# Patient Record
Sex: Female | Born: 1985 | Race: White | Hispanic: No | Marital: Single | Smoking: Never smoker
Health system: Southern US, Community
[De-identification: ages and names within clinical notes are randomized; demographics above are authoritative.]

## PROBLEM LIST (undated history)

## (undated) HISTORY — PX: WISDOM TOOTH EXTRACTION: SHX21

---
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
## 2009-02-02 ENCOUNTER — Emergency Department (HOSPITAL_COMMUNITY): Admission: EM | Admit: 2009-02-02 | Discharge: 2009-02-02 | Payer: Self-pay | Admitting: Family Medicine

## 2011-01-12 ENCOUNTER — Inpatient Hospital Stay (INDEPENDENT_AMBULATORY_CARE_PROVIDER_SITE_OTHER)
Admission: RE | Admit: 2011-01-12 | Discharge: 2011-01-12 | Disposition: A | Discharge status: Home / Self Care | Payer: BC Managed Care – PPO | Source: Ambulatory Visit | Attending: Emergency Medicine | Admitting: Emergency Medicine

## 2011-01-12 DIAGNOSIS — L259 Unspecified contact dermatitis, unspecified cause: Secondary | ICD-10-CM

## 2011-01-12 DIAGNOSIS — T7840XA Allergy, unspecified, initial encounter: Secondary | ICD-10-CM

## 2011-04-07 ENCOUNTER — Inpatient Hospital Stay (INDEPENDENT_AMBULATORY_CARE_PROVIDER_SITE_OTHER)
Admission: RE | Admit: 2011-04-07 | Discharge: 2011-04-07 | Disposition: A | Discharge status: Home / Self Care | Payer: BC Managed Care – PPO | Source: Ambulatory Visit | Attending: Emergency Medicine | Admitting: Emergency Medicine

## 2011-04-07 DIAGNOSIS — M79609 Pain in unspecified limb: Secondary | ICD-10-CM

## 2011-04-07 LAB — D-DIMER, QUANTITATIVE: D-Dimer, Quant: 0.96 ug/mL-FEU — ABNORMAL HIGH (ref 0.00–0.48)

## 2011-04-08 ENCOUNTER — Ambulatory Visit (HOSPITAL_COMMUNITY)
Admission: AD | Admit: 2011-04-08 | Discharge: 2011-04-08 | Disposition: A | Discharge status: Home / Self Care | Payer: BC Managed Care – PPO | Source: Ambulatory Visit | Attending: Emergency Medicine | Admitting: Emergency Medicine

## 2011-04-08 DIAGNOSIS — M79609 Pain in unspecified limb: Secondary | ICD-10-CM | POA: Insufficient documentation

## 2011-04-20 ENCOUNTER — Ambulatory Visit (INDEPENDENT_AMBULATORY_CARE_PROVIDER_SITE_OTHER): Payer: BC Managed Care – PPO | Admitting: Family Medicine

## 2011-04-20 VITALS — BP 133/85 | Ht 63.0 in | Wt 160.0 lb

## 2011-04-20 DIAGNOSIS — S86819A Strain of other muscle(s) and tendon(s) at lower leg level, unspecified leg, initial encounter: Secondary | ICD-10-CM

## 2011-04-20 DIAGNOSIS — S86119A Strain of other muscle(s) and tendon(s) of posterior muscle group at lower leg level, unspecified leg, initial encounter: Secondary | ICD-10-CM

## 2011-04-20 MED ORDER — MELOXICAM 15 MG PO TABS: 15.0000 mg | ORAL_TABLET | Freq: Every day | ORAL | Status: DC

## 2011-04-20 NOTE — Progress Notes (Signed)
  Subjective:    Patient ID: Madeline Cobb, female    DOB: 12/24/1985, 25 y.o.   MRN: [id number]  HPI  Yamila is a plesant 25 yo who has been  training for a half marathon. On 04/07/11 she was running and she felt tightness in her right calf and she keep running shortly after excruciating pain on her right calf and she could not continue running. She was sen in the urgent care and diagnosed with a calf strain . She went the next day to have a MSK U/S which diagnosed a gastroc. strain. She was recommended to use a calf sleeve, iced, rest and voltaren. She was doing well until 04/18/11 when she tried to run again and she developed severe pain on her right calf. She denies any numbness or tingling she is walking w/o a limp.  No past medical history on file. No current outpatient prescriptions on file prior to visit.   Allergies no known allergies    Review of Systems  Constitutional: Negative for fever, chills and fatigue.  Musculoskeletal: Negative for back pain, joint swelling and gait problem.       R calf pain with HPI  Neurological: Negative for weakness and numbness.       Objective:   Physical Exam  Constitutional:       BP 133/85  Ht 5\' 3"  (1.6 m)  Wt 160 lb (72.576 kg)  BMI 28.34 kg/m2   Pulmonary/Chest: Effort normal.  Musculoskeletal:       Posterior right calf/ intact skin, mild swelling no hematomas. There is TTP in the lateral head of the gastrocnemius muscle in the mid third of her calf. No defect. Achilles tendon intact. Thompson test negative. She is able to stand on plantar flexion. There is pain with plantar flexion against resistance. Gait independent without a limp Sensation intact distally. Feet with mild arch.    Neurological: She is alert.  Skin: Skin is warm. No rash noted. No erythema. No pallor.  Psychiatric: She has a normal mood and affect. Judgment normal.   MSK U/S : No hematomas or tear visualized.     Assessment & Plan:   1.  Gastrocnemius muscle strain  meloxicam (MOBIC) 15 MG tablet  Calf sleeve. Cryotherapy. Cross training. MOBIC. GASTROC STRETCHES EXERCISES EXPLAINED AND HANDOUT GIVEN. F/u IN 4 WEEKS.

## 2011-07-03 ENCOUNTER — Ambulatory Visit (INDEPENDENT_AMBULATORY_CARE_PROVIDER_SITE_OTHER): Payer: BC Managed Care – PPO | Admitting: Family Medicine

## 2011-07-03 VITALS — BP 131/88

## 2011-07-03 DIAGNOSIS — S86819A Strain of other muscle(s) and tendon(s) at lower leg level, unspecified leg, initial encounter: Secondary | ICD-10-CM

## 2011-07-03 DIAGNOSIS — S86119A Strain of other muscle(s) and tendon(s) of posterior muscle group at lower leg level, unspecified leg, initial encounter: Secondary | ICD-10-CM

## 2011-07-06 DIAGNOSIS — S86119A Strain of other muscle(s) and tendon(s) of posterior muscle group at lower leg level, unspecified leg, initial encounter: Secondary | ICD-10-CM | POA: Insufficient documentation

## 2011-07-06 NOTE — Progress Notes (Signed)
  Subjective:    Patient ID: Madeline Cobb, female    DOB: 03/08/1986, 25 y.o.   MRN: [id number]  HPI  Acute onset right calf pain as she was runnig up a hill. Hairfield and localized. Unable to finish the run. Has been using crutches sinnce this occurred (48 hr ago) and has pain with weight bearing still. Similar injury in August. Longest run is usually 5-10 k. Trains qod, varied surfaces.   No foot numbness, no knee pain. No bruising.  Review of Systems ,nfnw     Objective:   Physical Exam  Vital signs reviewed. GENERAL: Well developed, well nourished, overweight, no acute distress. Walking with crutches. Right calf ttp mid calf, no defect. Achiiles is intact, normal thompson test. Calf is without bruising, no erythmea or warmth. ULTRASOUND:  Achiiles intact and normall in appearance. Medial and lateral gastroc and soleus reveal no defect, no increased areas of vascularity using doppler.        Assessment & Plan:  Calf strain--moderate. Copnservative care including icing, WBAT, gradual advancement to walking. RTC 2-3 weeks--will start eccentric rehab exercises then and if possible will do gait analysis.

## 2011-07-24 ENCOUNTER — Ambulatory Visit (INDEPENDENT_AMBULATORY_CARE_PROVIDER_SITE_OTHER): Payer: BC Managed Care – PPO | Admitting: Sports Medicine

## 2011-07-24 VITALS — BP 126/82

## 2011-07-24 DIAGNOSIS — S838X9A Sprain of other specified parts of unspecified knee, initial encounter: Secondary | ICD-10-CM

## 2011-07-24 DIAGNOSIS — S86119A Strain of other muscle(s) and tendon(s) of posterior muscle group at lower leg level, unspecified leg, initial encounter: Secondary | ICD-10-CM

## 2011-07-24 NOTE — Patient Instructions (Signed)
Use green sports insoles with heel lifts in running shoes  Use calf sleeve when you are exercising  Restart calf exercises for 1st week do them on both legs- 3 sets of 15 reps, the next week try doing the exercises on 1 leg at a time building up to 3 sets of 15 reps- try to do these with knees straight and knees bent at 20 degree angle   Ok to start back running- with intervals of running 1 minute then walking 1 minute for a total of 20 minutes for 1 week, the next week increase to total of 25 minutes, and the 3rd week increase to total of 30 minute total, on the 4th week increase to intervals of 10 minutes of running and 2 minutes walking for a total of 40 minutes, 5th week increase to total of 50 minutes, and 6th week increase to total of 60 minutes  Please follow up with [place] in 6 weeks.  Thank you for seeing [place] today!

## 2011-07-24 NOTE — Progress Notes (Signed)
  Subjective:    Patient ID: Madeline Cobb, female    DOB: May 03, 1986, 25 y.o.   MRN: [id number]  HPI  Pt here for f/u of rt calf pain which she states about 60% improved.   No pain with walking. Has not been doing any running or calf exercises since last visit, per Dr. Donnetta Hail instruction.   She had this injury one time before last April Normally she runs about 4 days a week and up to about one hour for her runs  Review of Systems     Objective:   Physical Exam  Slight tenderness at 7 inches above calcaneus at juncture of the head of medial and lateral gastrocs on right calf Left calf nontender Moderately high arch bilaterally No significant foot breakdown Hyperextension of both knees there is mild Strong hip abduction Walking gait is neutral       Assessment & Plan:

## 2011-07-24 NOTE — Assessment & Plan Note (Signed)
We gave her a new compression sleeve  Begin a gradual walk run program  Begin a series of calf exercises  Sports and soles with a heel lift  Recheck in about 6 weeks  If she does reinjure her calf we may want to move from sports and soles custom orthotics

## 2011-08-30 ENCOUNTER — Ambulatory Visit: Payer: BC Managed Care – PPO | Admitting: Sports Medicine

## 2011-09-13 ENCOUNTER — Ambulatory Visit (INDEPENDENT_AMBULATORY_CARE_PROVIDER_SITE_OTHER): Payer: BC Managed Care – PPO | Admitting: Sports Medicine

## 2011-09-13 VITALS — BP 128/80

## 2011-09-13 DIAGNOSIS — S838X9A Sprain of other specified parts of unspecified knee, initial encounter: Secondary | ICD-10-CM

## 2011-09-13 DIAGNOSIS — S86119A Strain of other muscle(s) and tendon(s) of posterior muscle group at lower leg level, unspecified leg, initial encounter: Secondary | ICD-10-CM

## 2011-09-13 NOTE — Progress Notes (Signed)
  Subjective:    Patient ID: Madeline Cobb, female    DOB: 1986/05/31, 26 y.o.   MRN: [id number]  HPI Patient comes in for followup of a right calf strain. She has been running and training for a half marathon coming up in April of this year. She is actually doing very well, and is approximately 90% better. She has been using a calf sleeve, heel lifts. She did remove the sports insoles as these were causing her pain, she feels much better and her current insoles that came with the shoes. She continues to do the rehabilitation exercises.   Review of Systems    No fevers, chills, night sweats, weight loss, chest pain, or shortness of breath.  Objective:   Physical Exam General:  Well developed, well nourished, and in no acute distress. Neuro:  Alert and oriented x3, extra-ocular muscles intact. Skin: Warm and dry, no rashes noted.  Musculoskeletal: Right calf is unremarkable to inspection. There is no tenderness to palpation, and no gaps in the muscle itself. Full strength to inversion, eversion, dorsiflexion, plantar flexion.  Running gait is very neutral, the only abnormality is that she turns in her right foot slightly.    Assessment & Plan:   Calf strain: I think this is essentially healed. She should continue to do the rehabilitation exercises, I provided her with another set of heel lifts to place in her other shoes. She may continue to wear a calf sleeve. She may continue to use anti-inflammatories as needed. Once she really start training for a half marathon, she may come back to see [place] if pain recurs, and we can build her custom orthotics at that point, with a built in heel lift. We will see her back on an as-needed basis.

## 2011-11-07 LAB — HM PAP SMEAR: HM Pap smear: NEGATIVE

## 2012-01-16 ENCOUNTER — Encounter: Payer: Self-pay | Admitting: Sports Medicine

## 2012-01-16 ENCOUNTER — Ambulatory Visit (INDEPENDENT_AMBULATORY_CARE_PROVIDER_SITE_OTHER): Payer: BC Managed Care – PPO | Admitting: Sports Medicine

## 2012-01-16 VITALS — BP 110/74

## 2012-01-16 DIAGNOSIS — M25569 Pain in unspecified knee: Secondary | ICD-10-CM

## 2012-01-16 DIAGNOSIS — M25561 Pain in right knee: Secondary | ICD-10-CM

## 2012-01-16 NOTE — Assessment & Plan Note (Addendum)
New problem. Plan have patient use knee compression sleeve  gave iliotibial band and hip stretches.  Patient to return in 6 weeks for followup  Scaphoid pad added to her right insoles which also had heel lifts

## 2012-01-16 NOTE — Progress Notes (Signed)
  Subjective:    Patient ID: Madeline Cobb, female    DOB: 09/13/1985, 26 y.o.   MRN: [id number]  HPI Patient presents today with right lateral knee pain after running a half marathon 3 weeks ago. She says that she ran a race without any knee pain or injury. However, after the race, she noticed that her knee felt like it locked up and didn't want to bend. She has some swelling in that area and used ice and Aleve. The pain and swelling lasted about a week. It will hurt again if she tries to run and she has been swimming instead of running recently. She does not have a history of knee problems. Her left knee is fine.  Patient had been having trouble with a gastroc strain. She says that she felt a few twinges in that area during the race but if she would rest it it would feel better. She says that the race was quite hilly and also very rainy.  Review of Systems     Objective:   Physical Exam Vital signs reviewed General appearance - alert, well appearing, and in no distress Knee: Normal to inspection with no erythema or effusion or obvious bony abnormalities Valgus alignment of legs. Palpation normal with no warmth or joint line tenderness or patellar tenderness or condyle tenderness. ROM normal in flexion and extension and lower leg rotation. Ligaments with solid consistent endpoints including ACL, PCL, and  MCL. Slight laxity of LCL Negative Mcmurray's and provocative meniscal tests. Non painful patellar compression. Patellar and quadriceps tendons unremarkable. Hamstring and quadriceps strength is normal.  Foot: Mild pronation of right foot      Assessment & Plan:

## 2012-02-28 ENCOUNTER — Ambulatory Visit (INDEPENDENT_AMBULATORY_CARE_PROVIDER_SITE_OTHER): Payer: BC Managed Care – PPO | Admitting: Sports Medicine

## 2012-02-28 VITALS — BP 125/85

## 2012-02-28 DIAGNOSIS — M629 Disorder of muscle, unspecified: Secondary | ICD-10-CM

## 2012-02-28 DIAGNOSIS — M7631 Iliotibial band syndrome, right leg: Secondary | ICD-10-CM

## 2012-02-28 MED ORDER — KETOPROFEN POWD: Status: DC

## 2012-02-28 NOTE — Progress Notes (Signed)
  Subjective:    Patient ID: Madeline Cobb, female    DOB: [birth year], 26 y.o.   MRN: [id number]  HPI Patient comes in today for followup. Still suffering from lateral right knee pain, especially with running. She has been compliant with her hip abductor strengthening exercises as well as her IT band stretches but despite this her symptoms persist. Pain occurs about 2 miles into a run. She describes the pain as Caliendo in quality with some mild associated swelling. Knee occasionally pops as well. No pain with walking, no feelings of instability, no pain at rest. At her last visit she was also given a new navicular pad for her right foot to help with excessive pronation.   Review of Systems     Objective:   Physical Exam Right knee shows full range of motion, no effusion, no significant soft tissue swelling. She is tender to palpation along the distal IT band as it crosses the lateral femoral condyle. Slight lateral joint line tenderness but a negative McMurray's, negative thesalys. Knee is stable to valgus and varus stressing. She still has quite a bit of hip abductor weakness with resistance and a mildly positive Obers. She is neurovascularly intact distally and walking with a slight limp.  Muscle skeletal ultrasound was performed. Views were obtained in both the longitudinal and transverse planes. She does have a thin layer of edema along the distal IT band on the right no discrete tear is seen. Comparison was made to the uninvolved left knee and no abnormalities were seen here. Visualized portion of the lateral meniscus showed no edema and no discrete tears.       Assessment & Plan:  #1. 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 I've also recommended daily icing. She has a compression sleeve to  wear with running but I've recommended a 3 week period of relative rest. Patient can cross train during this time. Gradual resumption of running thereafter. Continue with her current orthotics. Followup when necessary

## 2012-08-30 ENCOUNTER — Encounter (HOSPITAL_COMMUNITY): Payer: Self-pay | Admitting: *Deleted

## 2012-08-30 ENCOUNTER — Emergency Department (INDEPENDENT_AMBULATORY_CARE_PROVIDER_SITE_OTHER)
Admission: EM | Admit: 2012-08-30 | Discharge: 2012-08-30 | Disposition: A | Discharge status: Home / Self Care | Payer: BC Managed Care – PPO | Source: Home / Self Care | Attending: Family Medicine | Admitting: Family Medicine

## 2012-08-30 DIAGNOSIS — J111 Influenza due to unidentified influenza virus with other respiratory manifestations: Secondary | ICD-10-CM

## 2012-08-30 MED ORDER — HYDROCOD POLST-CHLORPHEN POLST 10-8 MG/5ML PO LQCR: 5.0000 mL | Freq: Two times a day (BID) | ORAL | Status: DC

## 2012-08-30 NOTE — ED Provider Notes (Signed)
History     CSN: [id number]  Arrival date & time 08/30/12  1004   None     Chief Complaint  Patient presents with  . URI    (Consider location/radiation/quality/duration/timing/severity/associated sxs/prior treatment) Patient is a 27 y.o. female presenting with URI. The history is provided by the patient.  URI The primary symptoms include fever, fatigue, sore throat, cough and myalgias. Primary symptoms do not include headaches or rash. The current episode started 2 days ago. This is a new problem. The problem has not changed since onset. Symptoms associated with the illness include chills, congestion and rhinorrhea.    History reviewed. No pertinent past medical history.  History reviewed. No pertinent past surgical history.  History reviewed. No pertinent family history.  History  Substance Use Topics  . Smoking status: Never Smoker   . Smokeless tobacco: Not on file  . Alcohol Use: Not on file    OB History    Grav Para Term Preterm Abortions TAB SAB Ect Mult Living                  Review of Systems  Constitutional: Positive for fever, chills and fatigue.  HENT: Positive for congestion, sore throat and rhinorrhea.   Respiratory: Positive for cough.   Musculoskeletal: Positive for myalgias.  Skin: Negative for rash.  Neurological: Negative for headaches.    Allergies  Review of patient's allergies indicates no known allergies.  Home Medications   Current Outpatient Rx  Name  Route  Sig  Dispense  Refill  . HYDROCOD POLST-CPM POLST ER 10-8 MG/5ML PO LQCR   Oral   Take 5 mLs by mouth every 12 (twelve) hours.   115 mL   1   . KETOPROFEN POWD      Ketoprofen gel 20% aaa tid   60 g   2     BP 114/80  Pulse 94  Temp 98.8 F (37.1 C) (Oral)  Resp 20  SpO2 98%  LMP 08/23/2012  Physical Exam  Nursing note and vitals reviewed. Constitutional: She is oriented to person, place, and time. She appears well-developed and well-nourished. No distress.   HENT:  Head: Normocephalic.  Right Ear: External ear normal.  Left Ear: External ear normal.  Mouth/Throat: Oropharynx is clear and moist.  Neck: Normal range of motion. Neck supple.  Cardiovascular: Normal rate, normal heart sounds and intact distal pulses.   Pulmonary/Chest: Effort normal and breath sounds normal.  Abdominal: Soft. Bowel sounds are normal.  Neurological: She is alert and oriented to person, place, and time.  Skin: Skin is warm and dry.    ED Course  Procedures (including critical care time)  Labs Reviewed - No data to display No results found.   1. Influenza-like illness       MDM          Linna Hoff, MD 08/30/12 [phone]

## 2012-08-30 NOTE — ED Notes (Signed)
Pt  Reports       Symptoms  Of  Headache    Nasal   stuffyness   And  Congestion         Symptoms  X  2  Days   Pt      Sitting  Upright      On  Exam table            Speaking in  Complete  sentances            Skin is  Warm  And  Dry

## 2013-02-12 ENCOUNTER — Encounter: Payer: Self-pay | Admitting: *Deleted

## 2013-02-25 ENCOUNTER — Ambulatory Visit (INDEPENDENT_AMBULATORY_CARE_PROVIDER_SITE_OTHER): Payer: BC Managed Care – PPO | Admitting: Certified Nurse Midwife

## 2013-02-25 ENCOUNTER — Encounter: Payer: Self-pay | Admitting: Nurse Practitioner

## 2013-02-25 VITALS — BP 122/60 | HR 58 | Resp 12 | Ht 62.5 in | Wt 195.4 lb

## 2013-02-25 DIAGNOSIS — Z01419 Encounter for gynecological examination (general) (routine) without abnormal findings: Secondary | ICD-10-CM

## 2013-02-25 DIAGNOSIS — IMO0001 Reserved for inherently not codable concepts without codable children: Secondary | ICD-10-CM

## 2013-02-25 DIAGNOSIS — Z309 Encounter for contraceptive management, unspecified: Secondary | ICD-10-CM

## 2013-02-25 DIAGNOSIS — E049 Nontoxic goiter, unspecified: Secondary | ICD-10-CM

## 2013-02-25 DIAGNOSIS — Z0000 Encounter for general adult medical examination without abnormal findings: Secondary | ICD-10-CM

## 2013-02-25 LAB — POCT URINALYSIS DIPSTICK
Leukocytes, UA: NEGATIVE
Urobilinogen, UA: NEGATIVE

## 2013-02-25 LAB — THYROID PANEL WITH TSH
T3 Uptake: 44.9 % — ABNORMAL HIGH (ref 22.5–37.0)
T4, Total: 7.1 ug/dL (ref 5.0–12.5)

## 2013-02-25 LAB — HEMOGLOBIN, FINGERSTICK: Hemoglobin, fingerstick: 13.7 g/dL (ref 12.0–16.0)

## 2013-02-25 MED ORDER — NORETHINDRONE 0.35 MG PO TABS: 1.0000 | ORAL_TABLET | Freq: Every day | ORAL | Status: DC

## 2013-02-25 NOTE — Progress Notes (Signed)
27 y.o. G0P0 Single Caucasian Fe here for annual exam. Periods normal but would like to back on contraception for  Protection and cycle control. Stopped Yaz due to migraine headache increase with vision changes prior to headache. Headaches have decreased to only one in past 3 months, mild, uses OTC medication with good results.  Working on diet, with exercise. Desires STD screening today. No other health issues today  Patient's last menstrual period was 02/13/2013.          Sexually active: yes  The current method of family planning is OCP (estrogen/progesterone).    Exercising: yes  running Smoker:  no  Health Maintenance: Pap:  11/07/2011  Normal  MMG:  never Colonoscopy:  never BMD:   never TDaP:  2005 Labs: hgb-13.7   reports that she has never smoked. She has never used smokeless tobacco. She reports that  drinks alcohol. She reports that she does not use illicit drugs.  No past medical history on file.  No past surgical history on file.  Current Outpatient Prescriptions  Medication Sig Dispense Refill  . chlorpheniramine-HYDROcodone (TUSSIONEX PENNKINETIC ER) 10-8 MG/5ML LQCR Take 5 mLs by mouth every 12 (twelve) hours.  115 mL  1  . drospirenone-ethinyl estradiol (YAZ,GIANVI,LORYNA) 3-0.02 MG tablet Take 1 tablet by mouth daily.      . Ketoprofen POWD Ketoprofen gel 20% aaa tid  60 g  2  . Multiple Vitamin (MULTIVITAMIN) tablet Take 1 tablet by mouth daily.       No current facility-administered medications for this visit.    Family History  Problem Relation Age of Onset  . Diabetes Father   . Hypertension Father   . Depression Sister   . CVA Maternal Grandmother   . Hypertension Paternal Grandmother   . Cancer Paternal Grandfather     lung cancer  . Hypertension Paternal Grandfather     ROS:  Pertinent items are noted in HPI.  Otherwise, a comprehensive ROS was negative.  Exam:   BP 122/60  Pulse 58  Resp 12  Ht 5' 2.5" (1.588 m)  Wt 195 lb 6.4 oz (88.633  kg)  BMI 35.15 kg/m2  LMP 02/13/2013 Height: 5' 2.5" (158.8 cm)  Ht Readings from Last 3 Encounters:  02/25/13 5' 2.5" (1.588 m)  04/20/11 5\' 3"  (1.6 m)    General appearance: alert, cooperative and appears stated age Head: Normocephalic, without obvious abnormality, atraumatic Neck: no adenopathy, supple, symmetrical, trachea midline and thyroid enlarged on right, no nodule noted Lungs: clear to auscultation bilaterally Breasts: normal appearance, no masses or tenderness, No nipple retraction or dimpling, No nipple discharge or bleeding, No axillary or supraclavicular adenopathy Heart: regular rate and rhythm Abdomen: soft, non-tender; no masses,  no organomegaly Extremities: extremities normal, atraumatic, no cyanosis or edema Skin: Skin color, texture, turgor normal. No rashes or lesions Lymph nodes: Cervical, supraclavicular, and axillary nodes normal. No abnormal inguinal nodes palpated Neurologic: Grossly normal   Pelvic: External genitalia:  no lesions              Urethra:  normal appearing urethra with no masses, tenderness or lesions              Bartholin's and Skene's: normal                 Vagina: normal appearing vagina with normal color and discharge, no lesions              Cervix: normal, non tender  Pap taken: no Bimanual Exam:  Uterus:  normal size, contour, position, consistency, mobility, non-tender and anteverted              Adnexa: normal adnexa and no mass, fullness, tenderness               Rectovaginal: Confirms               Anus:  normal sphincter tone, no lesions  A:  Well Woman with normal exam  Contraception desired previous OCP use with Migraine headache with aura  STD screening  Enlarged  thyroid on right  P:  Reviewed health and wellness pertinent to exam  Discussed POP use due headache occurrence with OCP. Discussed bleeding profile expectations and adjustment period. Questions answered.  Rx Micronor see  order  Lab:GC,Chlamydia,RPR,HIV  Discussed finding and need for evaluation with lab work and possibly [place].Marland Kitchen Patient agreeable.  Lab:TSH with thyroid panel   Pap smear as per guidelines   pap smear not taken today  counseled on STD prevention, adequate intake of calcium and vitamin D, diet and exercise  return annually or prn  An After Visit Summary was printed and given to the patient.

## 2013-02-25 NOTE — Patient Instructions (Signed)
General topics  Next pap or exam is  due in 1 year Take a Women's multivitamin Take 1200 mg. of calcium daily - prefer dietary If any concerns in interim to call back  Breast Self-Awareness Practicing breast self-awareness may pick up problems early, prevent significant medical complications, and possibly save your life. By practicing breast self-awareness, you can become familiar with how your breasts look and feel and if your breasts are changing. This allows you to notice changes early. It can also offer you some reassurance that your breast health is good. One way to learn what is normal for your breasts and whether your breasts are changing is to do a breast self-exam. If you find a lump or something that was not present in the past, it is best to contact your caregiver right away. Other findings that should be evaluated by your caregiver include nipple discharge, especially if it is bloody; skin changes or reddening; areas where the skin seems to be pulled in (retracted); or new lumps and bumps. Breast pain is seldom associated with cancer (malignancy), but should also be evaluated by a caregiver. BREAST SELF-EXAM The best time to examine your breasts is 5 7 days after your menstrual period is over.  ExitCare Patient Information 2013 [employer].   Exercise to Stay Healthy Exercise helps you become and stay healthy. EXERCISE IDEAS AND TIPS Choose exercises that:  You enjoy.  Fit into your day. You do not need to exercise really hard to be healthy. You can do exercises at a slow or medium level and stay healthy. You can:  Stretch before and after working out.  Try yoga, Pilates, or tai chi.  Lift weights.  Walk fast, swim, jog, run, climb stairs, bicycle, dance, or rollerskate.  Take aerobic classes. Exercises that burn about 150 calories:  Running 1  miles in 15 minutes.  Playing volleyball for 45 to 60 minutes.  Washing and waxing a car for 45 to 60  minutes.  Playing touch football for 45 minutes.  Walking 1  miles in 35 minutes.  Pushing a stroller 1  miles in 30 minutes.  Playing basketball for 30 minutes.  Raking leaves for 30 minutes.  Bicycling 5 miles in 30 minutes.  Walking 2 miles in 30 minutes.  Dancing for 30 minutes.  Shoveling snow for 15 minutes.  Swimming laps for 20 minutes.  Walking up stairs for 15 minutes.  Bicycling 4 miles in 15 minutes.  Gardening for 30 to 45 minutes.  Jumping rope for 15 minutes.  Washing windows or floors for 45 to 60 minutes. Document Released: 09/16/2010 Document Revised: 11/06/2011 Document Reviewed: 09/16/2010 ExitCare Patient Information 2013 [employer].   Other topics ( that may be useful information):    Sexually Transmitted Disease Sexually transmitted disease (STD) refers to any infection that is passed from person to person during sexual activity. This may happen by way of saliva, semen, blood, vaginal mucus, or urine. Common STDs include:  Gonorrhea.  Chlamydia.  Syphilis.  HIV/AIDS.  Genital herpes.  Hepatitis B and C.  Trichomonas.  Human papillomavirus (HPV).  Pubic lice. CAUSES  An STD may be spread by bacteria, virus, or parasite. A person can get an STD by:  Sexual intercourse with an infected person.  Sharing sex toys with an infected person.  Sharing needles with an infected person.  Having intimate contact with the genitals, mouth, or rectal areas of an infected person. SYMPTOMS  Some people may not have any symptoms, but   they can still pass the infection to others. Different STDs have different symptoms. Symptoms include:  Painful or bloody urination.  Pain in the pelvis, abdomen, vagina, anus, throat, or eyes.  Skin rash, itching, irritation, growths, or sores (lesions). These usually occur in the genital or anal area.  Abnormal vaginal discharge.  Penile discharge in men.  Soft, flesh-colored skin growths in the  genital or anal area.  Fever.  Pain or bleeding during sexual intercourse.  Swollen glands in the groin area.  Yellow skin and eyes (jaundice). This is seen with hepatitis. DIAGNOSIS  To make a diagnosis, your caregiver may:  Take a medical history.  Perform a physical exam.  Take a specimen (culture) to be examined.  Examine a sample of discharge under a microscope.  Perform blood test TREATMENT   Chlamydia, gonorrhea, trichomonas, and syphilis can be cured with antibiotic medicine.  Genital herpes, hepatitis, and HIV can be treated, but not cured, with prescribed medicines. The medicines will lessen the symptoms.  Genital warts from HPV can be treated with medicine or by freezing, burning (electrocautery), or surgery. Warts may come back.  HPV is a virus and cannot be cured with medicine or surgery.However, abnormal areas may be followed very closely by your caregiver and may be removed from the cervix, vagina, or vulva through office procedures or surgery. If your diagnosis is confirmed, your recent sexual partners need treatment. This is true even if they are symptom-free or have a negative culture or evaluation. They should not have sex until their caregiver says it is okay. HOME CARE INSTRUCTIONS  All sexual partners should be informed, tested, and treated for all STDs.  Take your antibiotics as directed. Finish them even if you start to feel better.  Only take over-the-counter or prescription medicines for pain, discomfort, or fever as directed by your caregiver.  Rest.  Eat a balanced diet and drink enough fluids to keep your urine clear or pale yellow.  Do not have sex until treatment is completed and you have followed up with your caregiver. STDs should be checked after treatment.  Keep all follow-up appointments, Pap tests, and blood tests as directed by your caregiver.  Only use latex condoms and water-soluble lubricants during sexual activity. Do not use  petroleum jelly or oils.  Avoid alcohol and illegal drugs.  Get vaccinated for HPV and hepatitis. If you have not received these vaccines in the past, talk to your caregiver about whether one or both might be right for you.  Avoid risky sex practices that can break the skin. The only way to avoid getting an STD is to avoid all sexual activity.Latex condoms and dental dams (for oral sex) will help lessen the risk of getting an STD, but will not completely eliminate the risk. SEEK MEDICAL CARE IF:   You have a fever.  You have any new or worsening symptoms. Document Released: 11/04/2002 Document Revised: 11/06/2011 Document Reviewed: 11/11/2010 ExitCare Patient Information 2013 [employer]Care[employer].    Domestic Abuse You are being battered or abused if someone close to you hits, pushes, or physically hurts you in any way. You also are being abused if you are forced into activities. You are being sexually abused if you are forced to have sexual contact of any kind. You are being emotionally abused if you are made to feel worthless or if you are constantly threatened. It is important to remember that help is available. No one has the right to abuse you. PREVENTION OF FURTHER   ABUSE  Learn the warning signs of danger. This varies with situations but may include: the use of alcohol, threats, isolation from friends and family, or forced sexual contact. Leave if you feel that violence is going to occur.  If you are attacked or beaten, report it to the police so the abuse is documented. You do not have to press charges. The police can protect you while you or the attackers are leaving. Get the officer's name and badge number and a copy of the report.  Find someone you can trust and tell them what is happening to you: your caregiver, a nurse, clergy member, close friend or family member. Feeling ashamed is natural, but remember that you have done nothing wrong. No one deserves abuse. Document Released:  08/11/2000 Document Revised: 11/06/2011 Document Reviewed: 10/20/2010 ExitCare Patient Information 2013 [employer]Care[employer].    How Much is Too Much Alcohol? Drinking too much alcohol can cause injury, accidents, and health problems. These types of problems can include:   Car crashes.  Falls.  Family fighting (domestic violence).  Drowning.  Fights.  Injuries.  Burns.  Damage to certain organs.  Having a baby with birth defects. ONE DRINK CAN BE TOO MUCH WHEN YOU ARE:  Working.  Pregnant or breastfeeding.  Taking medicines. Ask your doctor.  Driving or planning to drive. If you or someone you know has a drinking problem, get help from a doctor.  Document Released: 06/10/2009 Document Revised: 11/06/2011 Document Reviewed: 06/10/2009 ExitCare Patient Information 2013 [employer].   Smoking Hazards Smoking cigarettes is extremely bad for your health. Tobacco smoke has over 200 known poisons in it. There are over 60 chemicals in tobacco smoke that cause cancer. Some of the chemicals found in cigarette smoke include:   Cyanide.  Benzene.  Formaldehyde.  Methanol (wood alcohol).  Acetylene (fuel used in welding torches).  Ammonia. Cigarette smoke also contains the poisonous gases nitrogen oxide and carbon monoxide.  Cigarette smokers have an increased risk of many serious medical problems and Smoking causes approximately:  90% of all lung cancer deaths in men.  80% of all lung cancer deaths in women.  90% of deaths from chronic obstructive lung disease. Compared with nonsmokers, smoking increases the risk of:  Coronary heart disease by 2 to 4 times.  Stroke by 2 to 4 times.  Men developing lung cancer by 23 times.  Women developing lung cancer by 13 times.  Dying from chronic obstructive lung diseases by 12 times.  . Smoking is the most preventable cause of death and disease in our society.  WHY IS SMOKING ADDICTIVE?  Nicotine is the chemical  agent in tobacco that is capable of causing addiction or dependence.  When you smoke and inhale, nicotine is absorbed rapidly into the bloodstream through your lungs. Nicotine absorbed through the lungs is capable of creating a powerful addiction. Both inhaled and non-inhaled nicotine may be addictive.  Addiction studies of cigarettes and spit tobacco show that addiction to nicotine occurs mainly during the teen years, when young people begin using tobacco products. WHAT ARE THE BENEFITS OF QUITTING?  There are many health benefits to quitting smoking.   Likelihood of developing cancer and heart disease decreases. Health improvements are seen almost immediately.  Blood pressure, pulse rate, and breathing patterns start returning to normal soon after quitting. QUITTING SMOKING   American Lung Association - 1-800-LUNGUSA  American Cancer Society - 1-800-ACS-2345 Document Released: 09/21/2004 Document Revised: 11/06/2011 Document Reviewed: 05/26/2009 ExitCare Patient Information 2013 ExitCare[employer]   LL[employer].   Stress Management Stress is a state of physical or mental tension that often results from changes in your life or normal routine. Some common causes of stress are:  Death of a loved one.  Injuries or severe illnesses.  Getting fired or changing jobs.  Moving into a new home. Other causes may be:  Sexual problems.  Business or financial losses.  Taking on a large debt.  Regular conflict with someone at home or at work.  Constant tiredness from lack of sleep. It is not just bad things that are stressful. It may be stressful to:  Win the lottery.  Get married.  Buy a new car. The amount of stress that can be easily tolerated varies from person to person. Changes generally cause stress, regardless of the types of change. Too much stress can affect your health. It may lead to physical or emotional problems. Too little stress (boredom) may also become stressful. SUGGESTIONS TO  REDUCE STRESS:  Talk things over with your family and friends. It often is helpful to share your concerns and worries. If you feel your problem is serious, you may want to get help from a professional counselor.  Consider your problems one at a time instead of lumping them all together. Trying to take care of everything at once may seem impossible. List all the things you need to do and then start with the most important one. Set a goal to accomplish 2 or 3 things each day. If you expect to do too many in a single day you will naturally fail, causing you to feel even more stressed.  Do not use alcohol or drugs to relieve stress. Although you may feel better for a short time, they do not remove the problems that caused the stress. They can also be habit forming.  Exercise regularly - at least 3 times per week. Physical exercise can help to relieve that "uptight" feeling and will relax you.  The shortest distance between despair and hope is often a good night's sleep.  Go to bed and get up on time allowing yourself time for appointments without being rushed.  Take a short "time-out" period from any stressful situation that occurs during the day. Close your eyes and take some deep breaths. Starting with the muscles in your face, tense them, hold it for a few seconds, then relax. Repeat this with the muscles in your neck, shoulders, hand, stomach, back and legs.  Take good care of yourself. Eat a balanced diet and get plenty of rest.  Schedule time for having fun. Take a break from your daily routine to relax. HOME CARE INSTRUCTIONS   Call if you feel overwhelmed by your problems and feel you can no longer manage them on your own.  Return immediately if you feel like hurting yourself or someone else. Document Released: 02/07/2001 Document Revised: 11/06/2011 Document Reviewed: 09/30/2007 ExitCare Patient Information 2013 ExitCare[employer].   

## 2013-02-25 NOTE — Progress Notes (Signed)
Reviewed CPRomine, MD 

## 2013-02-27 ENCOUNTER — Other Ambulatory Visit: Payer: Self-pay | Admitting: Certified Nurse Midwife

## 2013-02-27 DIAGNOSIS — E049 Nontoxic goiter, unspecified: Secondary | ICD-10-CM

## 2013-03-03 ENCOUNTER — Telehealth: Payer: Self-pay | Admitting: *Deleted

## 2013-03-03 NOTE — Telephone Encounter (Signed)
Left message on CB# to return call for results of test. sue

## 2013-03-03 NOTE — Telephone Encounter (Signed)
Message copied by Elnora Morrison on Mon Mar 03, 2013 12:41 PM ------      Message from: Verner Chol      Created: Thu Feb 27, 2013  8:11 AM       Notify patient that all STD screen collected negative      Thyroid level slightly elevated, needs [place] of thyroid(Order placed)       ------

## 2013-03-03 NOTE — Telephone Encounter (Signed)
Patient aware of need for Thyroid Ultra Sound per Ortencia Kick. Left message of date and time at [facility] for thyroid ultrasound to be done on July 14th @ 8:00am @ [address] [place] location. Patient to call me back to confirm of this message. sue

## 2013-03-04 ENCOUNTER — Telehealth: Payer: Self-pay | Admitting: *Deleted

## 2013-03-04 NOTE — Telephone Encounter (Signed)
Patient notified of appointment at [facility] July 14th @ 8           patient notified of date and time of thyroid ultra sound at [address] location on July 14th @ 8:00am.

## 2013-03-07 ENCOUNTER — Ambulatory Visit
Admission: RE | Admit: 2013-03-07 | Discharge: 2013-03-07 | Disposition: A | Discharge status: Home / Self Care | Payer: BC Managed Care – PPO | Source: Ambulatory Visit | Attending: Certified Nurse Midwife | Admitting: Certified Nurse Midwife

## 2013-03-07 DIAGNOSIS — E049 Nontoxic goiter, unspecified: Secondary | ICD-10-CM

## 2013-03-10 ENCOUNTER — Other Ambulatory Visit: Payer: BC Managed Care – PPO

## 2013-03-10 NOTE — Telephone Encounter (Signed)
Calling for result of thyroid test

## 2013-03-11 NOTE — Telephone Encounter (Signed)
Patient notified of results of Thyroid Ultra Sound per Ortencia Kick instructions. No questions voiced from patient at this time.

## 2013-03-13 ENCOUNTER — Telehealth: Payer: Self-pay | Admitting: Orthopedic Surgery

## 2013-03-13 NOTE — Telephone Encounter (Signed)
LMTCB  aa 

## 2013-03-13 NOTE — Telephone Encounter (Signed)
Message copied by Alfredo Batty on Thu Mar 13, 2013  1:34 PM ------      Message from: Verner Chol      Created: Tue Mar 11, 2013  8:00 AM       Notify that Thyroid US reviewed and thyroid still WNL as far as size, also noted was a very small nodule on right which shows no area of concern.      Will continue monitor at [facility] with checking size and thyroid blood work. ------

## 2013-03-13 NOTE — Telephone Encounter (Signed)
Message copied by Alfredo Batty on Thu Mar 13, 2013  1:04 PM ------      Message from: Verner Chol      Created: Tue Mar 11, 2013  8:00 AM       Notify that Thyroid US reviewed and thyroid still WNL as far as size, also noted was a very small nodule on right which shows no area of concern.      Will continue monitor at [facility] with checking size and thyroid blood work. ------

## 2013-03-13 NOTE — Telephone Encounter (Signed)
Spoke with pt re: DL advice about thyroid. Pt agreeable.

## 2013-10-07 ENCOUNTER — Ambulatory Visit: Payer: BC Managed Care – PPO | Admitting: Sports Medicine

## 2013-11-05 ENCOUNTER — Ambulatory Visit: Payer: BC Managed Care – PPO | Admitting: Sports Medicine

## 2014-01-16 IMAGING — US US SOFT TISSUE HEAD/NECK
1 series · 14 of 25 positions shown · non-contrast
Comparison: None.

CLINICAL DATA: Enlarged thyroid clinically, elevated T3 level

THYROID ULTRASOUND
TECHNIQUE: Ultrasound examination of the thyroid gland and adjacent
soft tissues was performed.

[Series 1: us soft tissue head/neck · 0.08mm/px · 14 of 43 slices shown]
[im 1/43]
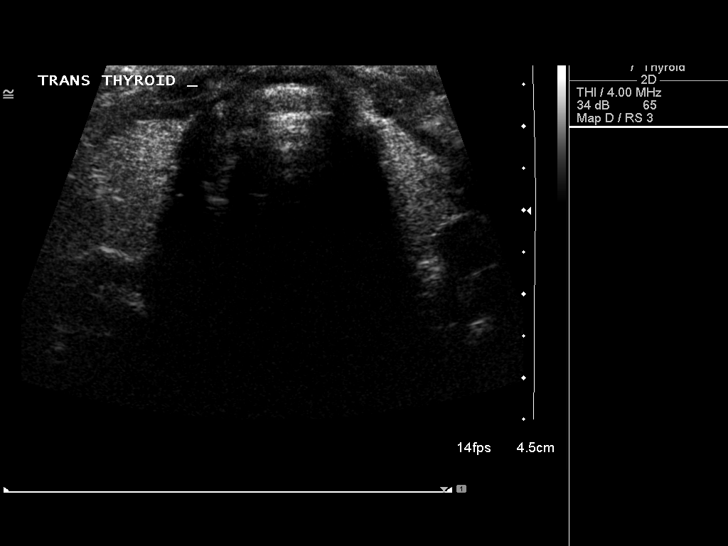
[im 4/43]
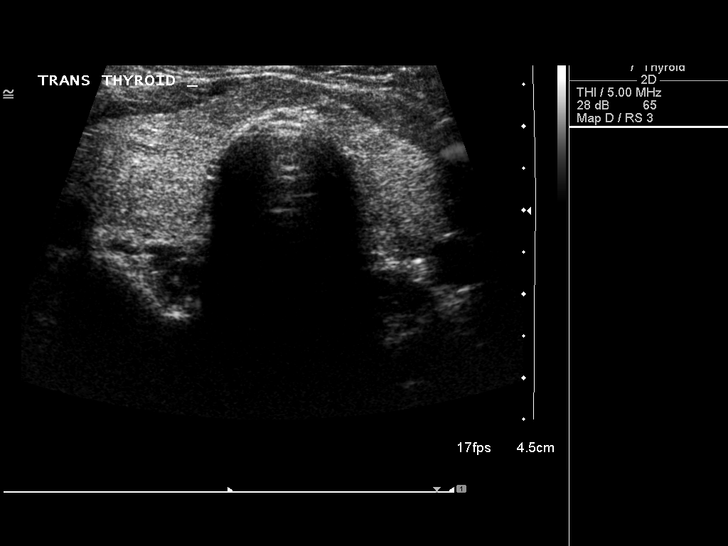
[im 8/43]
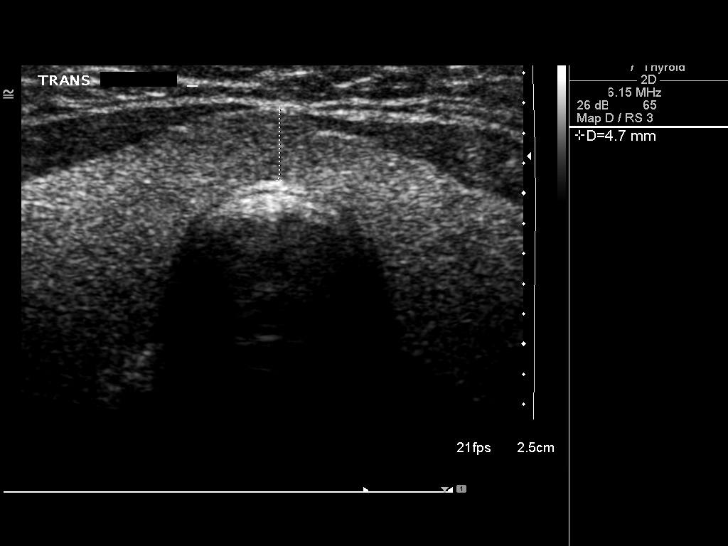
[im 11/43]
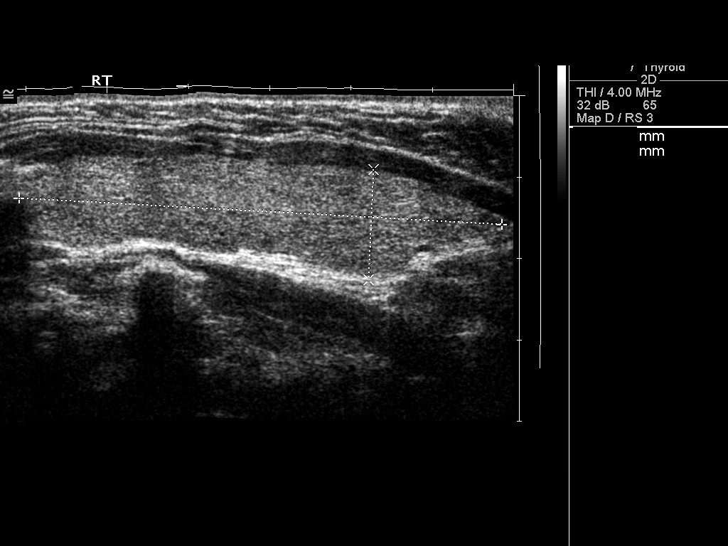
[im 15/43]
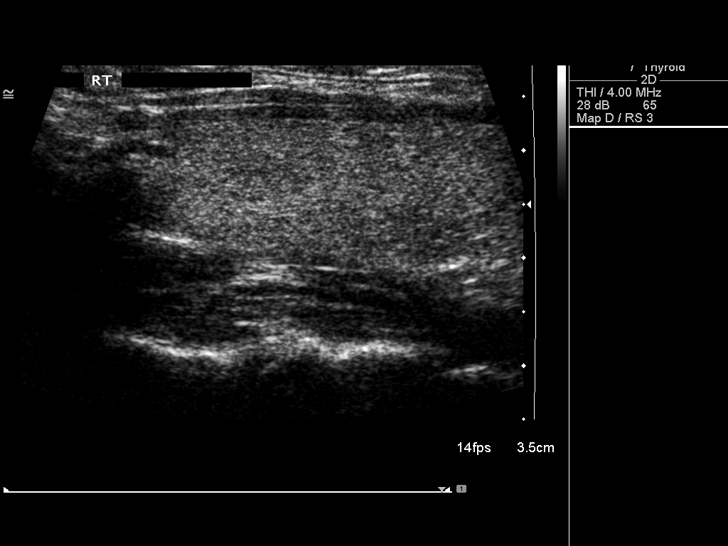
[im 16/43]
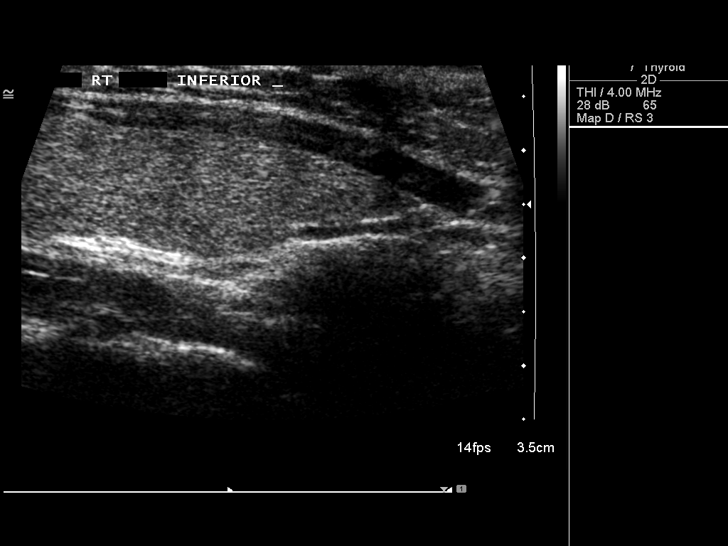
[im 20/43]
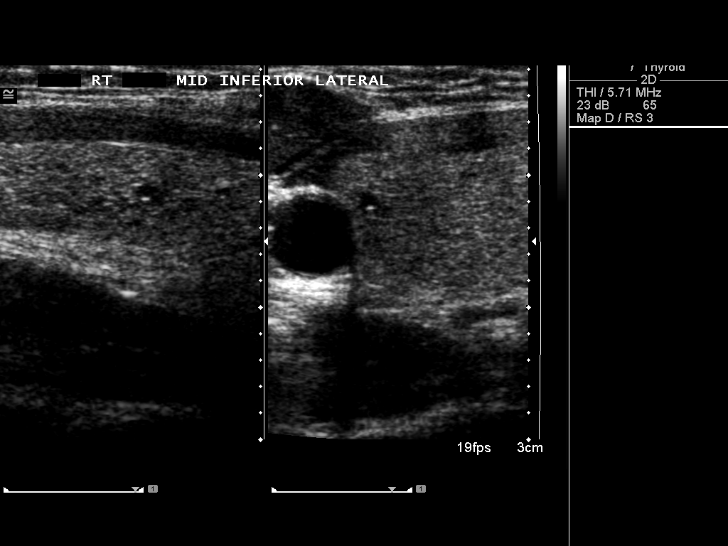
[im 23/43]
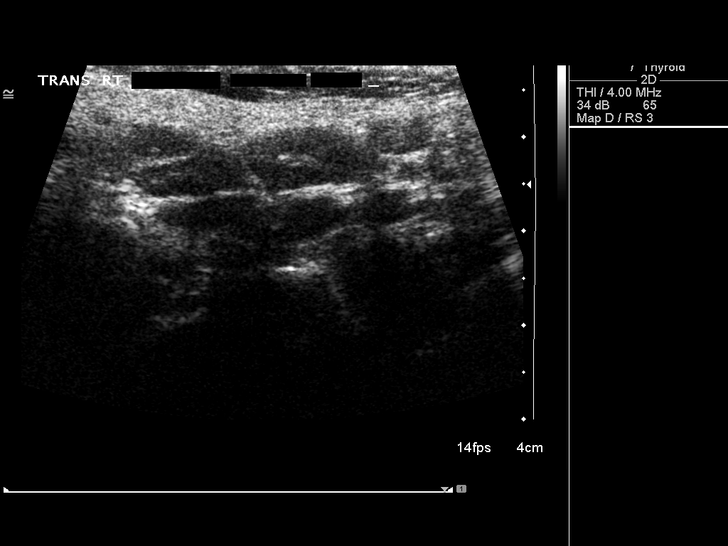
[im 27/43]
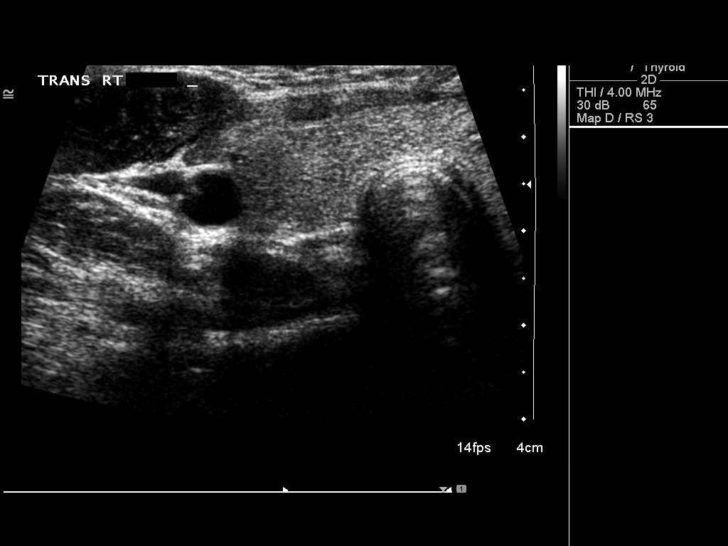
[im 29/43]
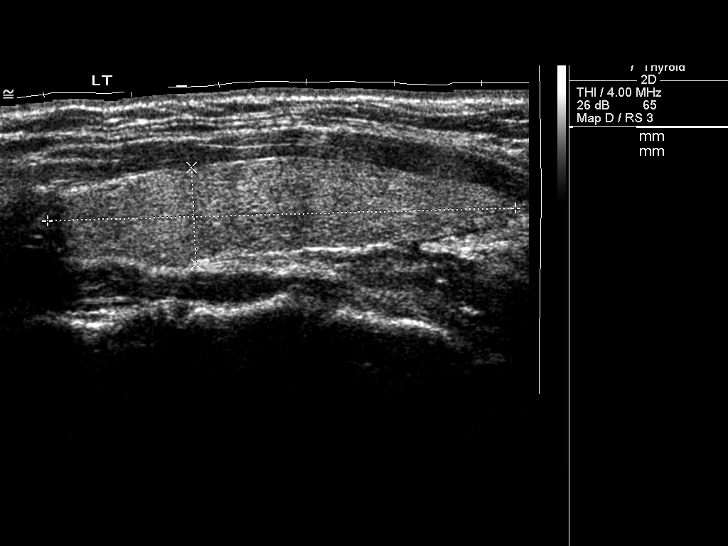
[im 32/43]
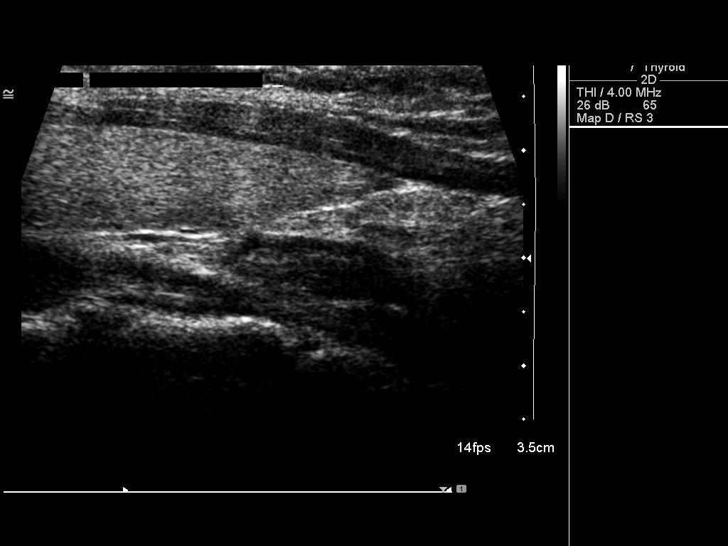
[im 36/43]
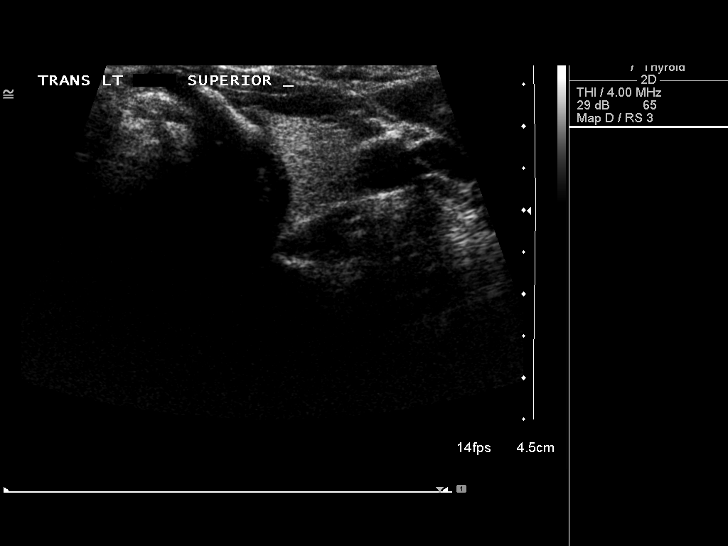
[im 39/43]
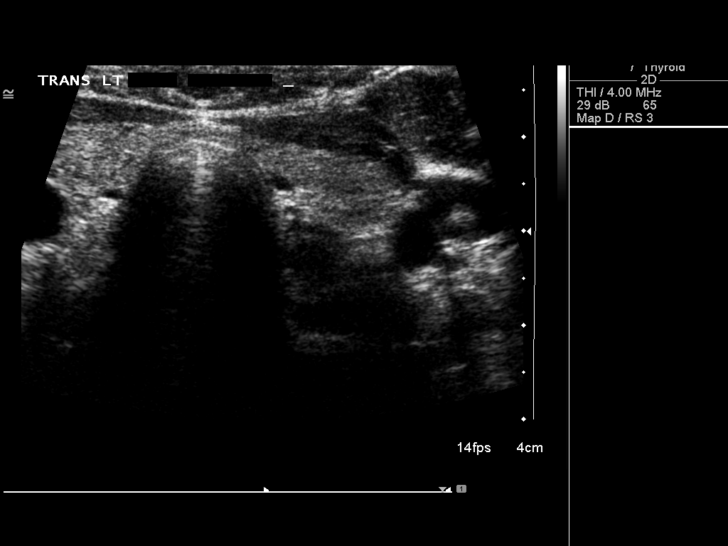
[im 43/43]
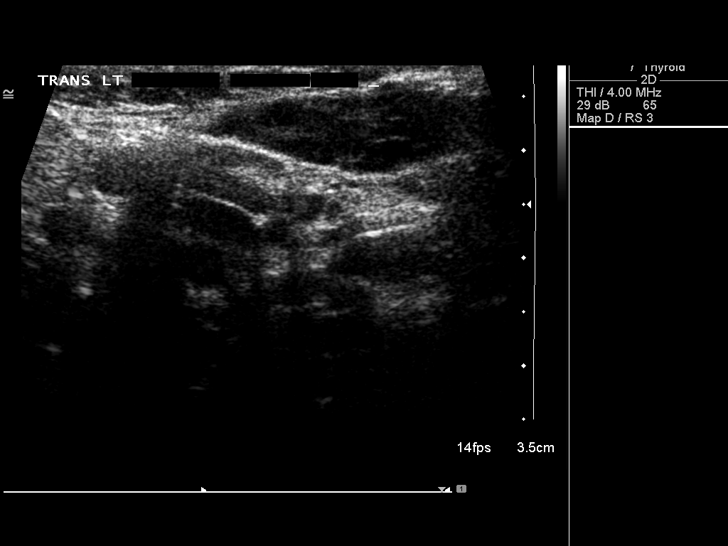

[14 of 25 positions shown; findings below may reference images not displayed]

FINDINGS: Right thyroid lobe:  6.2 x 1.4 x 1.8 cm.
Left thyroid lobe:  5.3 x 1.1 x 1.6 cm.
Isthmus:  4.7 mm in thickness.

Focal nodules:  Echogenicity of the thyroid parenchyma is
homogeneous.  Only a single 3 mm hypoechoic nodule is seen in the
right lower lobe.

Lymphadenopathy:  None visualized.
IMPRESSION: The thyroid gland is within upper limits of normal with only a
single 3 mm hypoechoic nodule noted on the right.

## 2014-02-26 ENCOUNTER — Ambulatory Visit (INDEPENDENT_AMBULATORY_CARE_PROVIDER_SITE_OTHER): Payer: BC Managed Care – PPO | Admitting: Nurse Practitioner

## 2014-02-26 ENCOUNTER — Other Ambulatory Visit: Payer: Self-pay | Admitting: Nurse Practitioner

## 2014-02-26 ENCOUNTER — Encounter: Payer: Self-pay | Admitting: Nurse Practitioner

## 2014-02-26 VITALS — BP 118/78 | HR 72 | Resp 16 | Ht 63.0 in | Wt 167.0 lb

## 2014-02-26 DIAGNOSIS — Z0000 Encounter for general adult medical examination without abnormal findings: Secondary | ICD-10-CM

## 2014-02-26 DIAGNOSIS — R87610 Atypical squamous cells of undetermined significance on cytologic smear of cervix (ASC-US): Secondary | ICD-10-CM

## 2014-02-26 DIAGNOSIS — Z3040 Encounter for surveillance of contraceptives, unspecified: Secondary | ICD-10-CM

## 2014-02-26 DIAGNOSIS — Z23 Encounter for immunization: Secondary | ICD-10-CM

## 2014-02-26 DIAGNOSIS — Z01419 Encounter for gynecological examination (general) (routine) without abnormal findings: Secondary | ICD-10-CM

## 2014-02-26 LAB — POCT URINALYSIS DIPSTICK
BILIRUBIN UA: NEGATIVE
Glucose, UA: NEGATIVE
Ketones, UA: NEGATIVE
Leukocytes, UA: NEGATIVE
NITRITE UA: NEGATIVE
PH UA: 5
PROTEIN UA: NEGATIVE
RBC UA: NEGATIVE
Urobilinogen, UA: NEGATIVE

## 2014-02-26 LAB — HEMOGLOBIN, FINGERSTICK: Hemoglobin, fingerstick: 14.4 g/dL (ref 12.0–16.0)

## 2014-02-26 MED ORDER — NORETHINDRONE 0.35 MG PO TABS: 1.0000 | ORAL_TABLET | Freq: Every day | ORAL | Status: DC

## 2014-02-26 NOTE — Patient Instructions (Signed)

## 2014-02-26 NOTE — Progress Notes (Signed)
28 y.o. G0P0 Single Caucasian Fe here for annual exam.  Menses now at 5 days. Regular. No cramps.  Same partner for 14 mos.  Increase amount of bruising over the past year.   Patient's last menstrual period was 02/23/2014.          Sexually active: Yes.    The current method of family planning is OCP (estrogen/progesterone) and condoms every time.    Exercising: Yes.    Running 4- 6 x weekly Smoker:  no  Health Maintenance: Pap:  10/2011 Neg TDaP: 2005.  Due Gardasil not done Labs: Hem: 14.4   UA: Clear   reports that she has never smoked. She has never used smokeless tobacco. She reports that she drinks alcohol. She reports that she does not use illicit drugs.  History reviewed. No pertinent past medical history.  Past Surgical History  Procedure Laterality Date  . Wisdom tooth extraction      Current Outpatient Prescriptions  Medication Sig Dispense Refill  . Calcium Carb-Cholecalciferol (CALCIUM 1000 + D PO) Take by mouth daily.      . Multiple Vitamin (MULTIVITAMIN) tablet Take 1 tablet by mouth daily.      . norethindrone (MICRONOR,CAMILA,ERRIN) 0.35 MG tablet Take 1 tablet (0.35 mg total) by mouth daily.  1 Package  12   No current facility-administered medications for this visit.    Family History  Problem Relation Age of Onset  . Diabetes Father   . Hypertension Father   . Depression Sister   . CVA Maternal Grandmother   . Hypertension Paternal Grandmother   . Cancer Paternal Grandfather     lung cancer  . Hypertension Paternal Grandfather     ROS:  Pertinent items are noted in HPI.  Otherwise, a comprehensive ROS was negative.  Exam:   BP 118/78  Pulse 72  Resp 16  Ht 5\' 3"  (1.6 m)  Wt 167 lb (75.751 kg)  BMI 29.59 kg/m2  LMP 02/23/2014 Height: 5\' 3"  (160 cm)  Ht Readings from Last 3 Encounters:  02/26/14 5\' 3"  (1.6 m)  02/25/13 5' 2.5" (1.588 m)  04/20/11 5\' 3"  (1.6 m)    General appearance: alert, cooperative and appears stated age Head:  Normocephalic, without obvious abnormality, atraumatic Neck: no adenopathy, supple, symmetrical, trachea midline and thyroid normal to inspection and palpation Lungs: clear to auscultation bilaterally Breasts: normal appearance, no masses or tenderness Heart: regular rate and rhythm Abdomen: soft, non-tender; no masses,  no organomegaly Extremities: extremities normal, atraumatic, no cyanosis or edema Skin: Skin color, texture, turgor normal. No rashes or lesions Lymph nodes: Cervical, supraclavicular, and axillary nodes normal. No abnormal inguinal nodes palpated Neurologic: Grossly normal   Pelvic: External genitalia:  no lesions              Urethra:  normal appearing urethra with no masses, tenderness or lesions              Bartholin's and Skene's: normal                 Vagina: normal appearing vagina with normal color and discharge, no lesions              Cervix: anteverted              Pap taken: Yes.   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

## 2014-02-27 LAB — COMPREHENSIVE METABOLIC PANEL
ALT: 16 U/L (ref 0–35)
AST: 19 U/L (ref 0–37)
Albumin: 4.5 g/dL (ref 3.5–5.2)
Alkaline Phosphatase: 121 U/L — ABNORMAL HIGH (ref 39–117)
BILIRUBIN TOTAL: 0.6 mg/dL (ref 0.2–1.2)
BUN: 14 mg/dL (ref 6–23)
CO2: 27 meq/L (ref 19–32)
Calcium: 9.7 mg/dL (ref 8.4–10.5)
Chloride: 104 mEq/L (ref 96–112)
Creat: 0.76 mg/dL (ref 0.50–1.10)
Glucose, Bld: 80 mg/dL (ref 70–99)
Potassium: 4.5 mEq/L (ref 3.5–5.3)
SODIUM: 139 meq/L (ref 135–145)
TOTAL PROTEIN: 7.2 g/dL (ref 6.0–8.3)

## 2014-02-27 LAB — TSH: TSH: 5.085 u[IU]/mL — AB (ref 0.350–4.500)

## 2014-02-27 LAB — CBC WITH DIFFERENTIAL/PLATELET
BASOS ABS: 0.1 10*3/uL (ref 0.0–0.1)
BASOS PCT: 1 % (ref 0–1)
EOS PCT: 2 % (ref 0–5)
Eosinophils Absolute: 0.1 10*3/uL (ref 0.0–0.7)
HCT: 43.6 % (ref 36.0–46.0)
Hemoglobin: 14.6 g/dL (ref 12.0–15.0)
Lymphocytes Relative: 28 % (ref 12–46)
Lymphs Abs: 2.1 10*3/uL (ref 0.7–4.0)
MCH: 31 pg (ref 26.0–34.0)
MCHC: 33.5 g/dL (ref 30.0–36.0)
MCV: 92.6 fL (ref 78.0–100.0)
Monocytes Absolute: 0.4 10*3/uL (ref 0.1–1.0)
Monocytes Relative: 6 % (ref 3–12)
NEUTROS PCT: 63 % (ref 43–77)
Neutro Abs: 4.7 10*3/uL (ref 1.7–7.7)
PLATELETS: 287 10*3/uL (ref 150–400)
RBC: 4.71 MIL/uL (ref 3.87–5.11)
RDW: 14.1 % (ref 11.5–15.5)
WBC: 7.4 10*3/uL (ref 4.0–10.5)

## 2014-03-03 NOTE — Progress Notes (Signed)
Encounter reviewed by Dr. Brook Silva.  

## 2014-03-04 ENCOUNTER — Other Ambulatory Visit: Payer: Self-pay | Admitting: Obstetrics and Gynecology

## 2014-03-04 ENCOUNTER — Telehealth: Payer: Self-pay | Admitting: *Deleted

## 2014-03-04 LAB — THYROID PROFILE - CHCC
FREE THYROXINE INDEX: 2.4 (ref 1.0–3.9)
T3 UPTAKE: 36.8 % (ref 22.5–37.0)
T4, Total: 6.4 ug/dL (ref 5.0–12.5)

## 2014-03-04 NOTE — Telephone Encounter (Signed)
I have attempted to contact this patient by phone with the following results: left message to return my call on answering machine (mobile per DPR).[phone] (Mobile)

## 2014-03-04 NOTE — Telephone Encounter (Signed)
Message copied by Luisa DagoPHILLIPS, STEPHANIE C on Wed Mar 04, 2014  9:44 AM ------      Message from: Ria CommentGRUBB, PATRICIA R      Created: Sun Mar 01, 2014  5:32 PM       Let patient know that CBC is normal, no sign of anything wrong that may be causing bruising.  Also the CMP is OK.  The TSH is elevated and in the past was normal.  Lets recheck TSH again in 2 weeks to make sure not a lab error.  This could cause fatigue but most likely not related to her bruising. ------

## 2014-03-04 NOTE — Progress Notes (Signed)
Orders opened in error

## 2014-03-04 NOTE — Telephone Encounter (Signed)
Pt notified in result note.  Closing encounter. 

## 2014-03-05 ENCOUNTER — Other Ambulatory Visit: Payer: Self-pay | Admitting: Certified Nurse Midwife

## 2014-03-05 DIAGNOSIS — R6889 Other general symptoms and signs: Secondary | ICD-10-CM

## 2014-03-05 LAB — IPS PAP TEST WITH HPV

## 2014-03-05 NOTE — Addendum Note (Signed)
Addended by: Verner CholLEONARD, DEBORAH S on: 03/05/2014 08:26 AM   Modules accepted: Orders

## 2014-03-06 ENCOUNTER — Telehealth: Payer: Self-pay | Admitting: Certified Nurse Midwife

## 2014-03-06 ENCOUNTER — Telehealth: Payer: Self-pay

## 2014-03-06 NOTE — Telephone Encounter (Signed)
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 Patient needs this evaluated with colposcopy please schedule, order in ------

## 2014-03-06 NOTE — Telephone Encounter (Signed)
Left message for patient to call back. Need to go over colpo benefits. °

## 2014-03-06 NOTE — Telephone Encounter (Signed)
Spoke with patient. Advised of PR: $359.45 for colpo. Patient agreeable. Mailed the In-Office procedure form that includes appointment date and time, patient copay, and cancellation policy.

## 2014-03-06 NOTE — Telephone Encounter (Signed)
Spoke with patient. Advised of message as seen below from Verner Choleborah S. Leonard CNM. Patient is agreeable and verbalizes understanding. Patient is currently on OCP for birth control. LMP was on 6/29. Patient agreeable to schedule colpo at this time. Requesting late morning appointment.Appointment scheduled for 7/29 at 11:30am with Dr.Lathrop. Patient agreeable to date and time. Colposcopy pre-procedure instructions given. Motrin instructions given. Motrin=Advil=Ibuprofen Can take 800 mg (Can purchase over the counter, you will need four 200 mg pills) every 8 hours as needed.  Take with food. Make sure to eat a meal before appointment and drink plenty of fluids. Patient verbalized understanding and will call to reschedule if will be on menses or has any concerns regarding pregnancy. Advised will need to cancel within 24 hours or will have $100.00 no show fee placed to account.   Routing to Verner Choleborah S. Leonard CNM  Cc: Dr.Lathrop

## 2014-03-25 ENCOUNTER — Ambulatory Visit (INDEPENDENT_AMBULATORY_CARE_PROVIDER_SITE_OTHER): Payer: BC Managed Care – PPO | Admitting: Gynecology

## 2014-03-25 VITALS — BP 100/80 | Resp 12 | Ht 63.0 in | Wt 172.0 lb

## 2014-03-25 DIAGNOSIS — R6889 Other general symptoms and signs: Secondary | ICD-10-CM

## 2014-03-25 DIAGNOSIS — R87610 Atypical squamous cells of undetermined significance on cytologic smear of cervix (ASC-US): Secondary | ICD-10-CM

## 2014-03-25 NOTE — Patient Instructions (Signed)

## 2014-03-25 NOTE — Progress Notes (Addendum)
Patient ID: Madeline Cobb, female   DOB: 06-12-1986, 28 y.o.   MRN: [id number]  Chief Complaint  Patient presents with  . Procedure    Patient is here for Colposcopy- Last Pap 02/26/14 ASCUS + HPV; TSH also drawn    HPI Madeline Cobb is a 28 y.o. female.   HPI  Indications: Pap smear on June 2015 showed: ASCUS with POSITIVE high risk HPV. Previous colposcopy: none. Prior cervical treatment: none.Procedure explained and patient's questions were invited and answered.  Consent form signed.    Role of HPV in genesis of SIL discussed with patient, and questions answered.     No past medical history on file.  Past Surgical History  Procedure Laterality Date  . Wisdom tooth extraction      Family History  Problem Relation Age of Onset  . Diabetes Father   . Hypertension Father   . Depression Sister   . CVA Maternal Grandmother   . Hypertension Paternal Grandmother   . Cancer Paternal Grandfather     lung cancer  . Hypertension Paternal Grandfather     Social History History  Substance Use Topics  . Smoking status: Never Smoker   . Smokeless tobacco: Never Used  . Alcohol Use: 0.0 oz/week    3-4 drink(s) per week    No Known Allergies  Current Outpatient Prescriptions  Medication Sig Dispense Refill  . Calcium Carb-Cholecalciferol (CALCIUM 1000 + D PO) Take by mouth daily.      . Multiple Vitamin (MULTIVITAMIN) tablet Take 1 tablet by mouth daily.      . norethindrone (MICRONOR,CAMILA,ERRIN) 0.35 MG tablet Take 1 tablet (0.35 mg total) by mouth daily.  3 Package  3   No current facility-administered medications for this visit.    Review of Systems Review of Systems  Blood pressure 100/80, resp. rate 12, height 5\' 3"  (1.6 m), weight 172 lb (78.019 kg), last menstrual period 02/23/2014.  Physical Exam Physical Exam  Nursing note and vitals reviewed. Constitutional: She appears well-developed and well-nourished.  Genitourinary:      Data  Reviewed   Assessment    Procedure Details  The risks and benefits of the procedure and Written informed consent obtained.  Marland KitchenSpeculum inserted atraumatically and cervix visualized.  3% acetic acid applied.  Cervix examined using 3.75 and 7.5 and 15   X magnification and green filter.    Gross appearance:normal  Squamocolumnar junction seen in entirety: yes   no mosaicism, no punctation, no abnormal vasculature and acetowhite lesion(s) noted at 12 o'clock  cervix swabbed with Lugol's solution, endocervical speculum placed, SCJ visualized - lesion at 12 o'clock, endocervical curettage performed, cervical biopsies taken at 12,6 o'clock, specimen labelled and sent to pathology and hemostasis achieved with Monsel's solution  Extent of lesion entirely seen: yes    Specimens: 12,6 o'clock  Complications: none.     Plan    Specimens labelled and sent to Pathology. Triage based on results      Rakhi Romagnoli H 03/25/2014, 12:07 PM    Patient tolerated procedure well.    Post biopsy instructions and AVS given to patient.     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 -ACUTE AND CHRONIC INFLAMMATION. 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 -IMMUNOSTAIN FOR P16 (PERFORMED ON  BLOCK B1 WITH ADEQUATE CONTROL) IS NEGATIVE FOR DIFFUSE POSITIVE STAINING. -ACUTE AND CHRONIC INFLAMMATION. (C) ENDOCERVIX, CURETTINGS: -PORTIONS OF BENIGN ENDOCERVICAL MUCOSA AND STRIPS OF BENIGN ENDOCERVICAL EPITHELIUM; NEGATIVE FOR DYSPLASIA OR ATYPIA.

## 2014-03-26 LAB — TSH: TSH: 3.137 u[IU]/mL (ref 0.350–4.500)

## 2014-03-27 LAB — IPS OTHER TISSUE BIOPSY

## 2014-03-31 ENCOUNTER — Telehealth: Payer: Self-pay | Admitting: Emergency Medicine

## 2014-03-31 DIAGNOSIS — IMO0002 Reserved for concepts with insufficient information to code with codable children: Secondary | ICD-10-CM

## 2014-03-31 NOTE — Telephone Encounter (Signed)
Message copied by Joeseph AmorFAST, Trumaine Wimer L on Tue Mar 31, 2014 10:40 AM ------      Message from: Douglass RiversLATHROP, Daltyn Degroat      Created: Mon Mar 30, 2014  9:49 AM       Inform one biopsy had some higher grade but in background of low grade, she can either repeat colpo and pap in 1318m or treat now with LEEP ------

## 2014-03-31 NOTE — Telephone Encounter (Signed)
Spoke with patient and message from Dr. Farrel GobbleLathrop given. Patient verbalized understanding and is agreeable to follow up. Stressed importance of close follow up. Patient states she had some bleeding after her colposcopy which lasted 3 days. She is taking progesterone only pills. Patient is unsure if bleeding was r/t to colposcopy or if was cycle. Advised patient can take home pregnancy test to ensure since LMP was 02/23/14, but this likely was a cycle as bleeding post colposcopy should be spotting only, not last for days. Advised patient to take home pregnancy test and call us back with results. Patient states "I'm sure I'm not pregnant, I just didn't know if I should have been bleeding like that after or it was my cycle".   Patient would like schedule 4 month follow up with colposcopy and pap smear. Discussed LEEP as well and patient opts to wait. Scheduled colposcopy and follow up pap for 07/03/14. Patient on ocp. Advised to call back if starts cycle for reschedule of procedure.  Ordered colposcopy and sent for precert to hold for closer to date.   Colposcopy pre-procedure instructions given. Motrin instructions given. Motrin=Advil=Ibuprofen Can take 800 mg (Can purchase over the counter, you will need four 200 mg pills) every 8 hours as needed.  Take with food. Make sure to eat a meal before appointment and drink plenty of fluids. Patient verbalized understanding and will call to reschedule if will be on menses or has any concerns regarding pregnancy. Advised will need to cancel within 24 hours or will have $100.00 no show fee placed to account. Patient is agreeable to instructions and will call back prn.  Routing to provider for final review. Patient agreeable to disposition. Will close encounter   04 Recall entered for 4 months.

## 2014-05-05 ENCOUNTER — Telehealth: Payer: Self-pay | Admitting: Gynecology

## 2014-05-05 NOTE — Telephone Encounter (Signed)
Patient calling with more questions about recent test results.

## 2014-05-05 NOTE — Telephone Encounter (Signed)
Patient calling with questions about results.  Patient wondering if she should go ahead and have LEEP done at this time instead of waiting the four months for repeat pap and colposcopy as scheduled.  Advised patient this is close follow up to ensure that cells are not further changing on cervix. Advised patient there may still be potential for LEEP if waits for colposcopy and pap in 4 months, but that is why close follow up in recommended.  Offered office visit with Dr. Farrel Gobble to discuss, patient declines.  She would like to know what her costs for LEEP now if patient decides to do it instead of waiting.  Advised would ask insurance to pre cert for cost of leep at this time and would return her call. She is agreeable.   Martie Lee can you precert a LEEP for this patient? [id number], dx 622.12 High Grade Dysplasia of cervix.

## 2014-05-06 NOTE — Telephone Encounter (Signed)
Left message for patient to call back. PR: $840.95 for LEEP

## 2014-05-25 NOTE — Telephone Encounter (Signed)
Left message for patient to call. Need to go over benefits for LEEP. Also need to advise of rate change and patient expectation for Colpo scheduled 11.06.2015. Rate change: colpo PR $403.69

## 2014-06-18 ENCOUNTER — Telehealth: Payer: Self-pay | Admitting: Gynecology

## 2014-06-18 NOTE — Telephone Encounter (Signed)
Pt cancelled her appointment for a procedure on 07/03/14. She lives in [place] and has found a doctor in [place].

## 2014-06-19 ENCOUNTER — Telehealth: Payer: Self-pay | Admitting: Emergency Medicine

## 2014-06-19 NOTE — Telephone Encounter (Signed)
Madeline Cobb  Signed  Service date: 06/18/2014 3:18 PM     Pt cancelled her appointment for a procedure on 07/03/14. She lives in [place] and has found a doctor in [place].

## 2014-06-19 NOTE — Telephone Encounter (Signed)
Dr. Hyacinth MeekerMiller,  Patient in 4 month recall from 03/2014 for four month colposcopy. Has cancelled colposcopy as scheduled and moved out of area. How do you advise to handle recall?

## 2014-06-19 NOTE — Telephone Encounter (Signed)
Encounter closed in error. New telephone encounter created.

## 2014-06-19 NOTE — Telephone Encounter (Signed)
Madeline Cobb  Signed  Service date: 06/18/2014 3:18 PM  Bookmark Copy    Pt cancelled her appointment for a procedure on 07/03/14. She lives in [place] and has found a doctor in [place].

## 2014-06-19 NOTE — Telephone Encounter (Signed)
Notes Recorded by Almedia Ballsracy Brezlyn Manrique, RN on 03/31/2014 at 11:06 AM Patient would like schedule 4 month follow up with colposcopy and pap smear. Discussed LEEP as well and patient opts to wait. Scheduled colposcopy and follow up pap for 07/03/14. Patient on ocp. Advised to call back if starts cycle for reschedule of procedure.  Ordered colposcopy and sent for precert to hold for closer to date. Notes Recorded by Bennye Almracy H Lathrop, MD on 03/30/2014 at 9:49 AM Inform one biopsy had some higher grade but in background of low grade, she can either repeat colpo and pap in 229m or treat now with LEEP

## 2014-06-24 NOTE — Telephone Encounter (Signed)
Patient is cancelled.  Awaiting follow up response from Dr. Hyacinth MeekerMiller.

## 2014-06-24 NOTE — Telephone Encounter (Signed)
Call to pt to advise appt for 07/03/14 has been cancelled but pt stated she has already cancelled appt due to moving see note below.

## 2014-06-26 NOTE — Telephone Encounter (Signed)
As pt stated she moved, ok to remove from recall.  Encounter closed.  CC:  Madeline RuddySally Yeakley

## 2014-06-26 NOTE — Telephone Encounter (Signed)
Out of recall per Dr. Hyacinth MeekerMiller.

## 2014-07-03 ENCOUNTER — Ambulatory Visit: Payer: BC Managed Care – PPO | Admitting: Gynecology

## 2015-02-15 ENCOUNTER — Other Ambulatory Visit: Payer: Self-pay | Admitting: Nurse Practitioner

## 2015-02-15 DIAGNOSIS — Z3040 Encounter for surveillance of contraceptives, unspecified: Secondary | ICD-10-CM

## 2015-02-16 MED ORDER — NORETHINDRONE 0.35 MG PO TABS: 1.0000 | ORAL_TABLET | Freq: Every day | ORAL | Status: AC

## 2015-02-16 NOTE — Telephone Encounter (Signed)
Medication refill request: Norethindrone 0.35 mg Last AEX:  02/26/14 with PG Next AEX: No AEX scheduled  Last MMG (if hormonal medication request): n/a Refill authorized: Please advise

## 2015-02-16 NOTE — Telephone Encounter (Signed)
Patient notified that rx has been sent into pharmacy

## 2015-02-16 NOTE — Telephone Encounter (Signed)
No noted needed

## 2015-02-16 NOTE — Telephone Encounter (Signed)
Patient lives in [place] now and is requesting a refill for her birth control.  She does not want to schedule an appointment here and is trying to get in with a physician in [place] but will run out of her birth control before then. She is using [facility] in [place] [phone]

## 2015-03-02 ENCOUNTER — Ambulatory Visit: Payer: BC Managed Care – PPO | Admitting: Nurse Practitioner
